# Patient Record
Sex: Male | Born: 1945 | Race: White | Hispanic: No | Marital: Married | Smoking: Former smoker
Health system: Southern US, Community
[De-identification: ages and names within clinical notes are randomized; demographics above are authoritative.]

## PROBLEM LIST (undated history)

## (undated) DIAGNOSIS — K219 Gastro-esophageal reflux disease without esophagitis: Secondary | ICD-10-CM

## (undated) DIAGNOSIS — K5730 Diverticulosis of large intestine without perforation or abscess without bleeding: Secondary | ICD-10-CM

## (undated) DIAGNOSIS — K649 Unspecified hemorrhoids: Secondary | ICD-10-CM

## (undated) DIAGNOSIS — E785 Hyperlipidemia, unspecified: Secondary | ICD-10-CM

## (undated) DIAGNOSIS — K635 Polyp of colon: Secondary | ICD-10-CM

## (undated) HISTORY — DX: Diverticulosis of large intestine without perforation or abscess without bleeding: K57.30

## (undated) HISTORY — DX: Gastro-esophageal reflux disease without esophagitis: K21.9

## (undated) HISTORY — DX: Unspecified hemorrhoids: K64.9

## (undated) HISTORY — DX: Hyperlipidemia, unspecified: E78.5

## (undated) HISTORY — DX: Polyp of colon: K63.5

## (undated) HISTORY — PX: LUMBAR LAMINECTOMY: SHX95

## (undated) HISTORY — PX: INGUINAL HERNIA REPAIR: SHX194

---

## 2004-03-07 ENCOUNTER — Ambulatory Visit: Payer: Self-pay | Admitting: Internal Medicine

## 2004-03-22 ENCOUNTER — Ambulatory Visit (HOSPITAL_COMMUNITY): Admission: RE | Admit: 2004-03-22 | Discharge: 2004-03-22 | Payer: Self-pay | Admitting: Specialist

## 2007-03-19 DIAGNOSIS — K635 Polyp of colon: Secondary | ICD-10-CM

## 2007-03-19 HISTORY — DX: Polyp of colon: K63.5

## 2007-04-01 ENCOUNTER — Ambulatory Visit: Payer: Self-pay | Admitting: Internal Medicine

## 2007-04-02 ENCOUNTER — Telehealth: Payer: Self-pay | Admitting: Internal Medicine

## 2007-04-02 LAB — CONVERTED CEMR LAB
ALT: 17 units/L (ref 0–53)
AST: 18 units/L (ref 0–37)
Alkaline Phosphatase: 48 units/L (ref 39–117)
BUN: 14 mg/dL (ref 6–23)
Basophils Relative: 0.1 % (ref 0.0–1.0)
CO2: 28 meq/L (ref 19–32)
Calcium: 9.2 mg/dL (ref 8.4–10.5)
Chloride: 106 meq/L (ref 96–112)
Cholesterol: 287 mg/dL (ref 0–200)
Creatinine, Ser: 0.9 mg/dL (ref 0.4–1.5)
Eosinophils Relative: 2.8 % (ref 0.0–5.0)
GFR calc Af Amer: 109 mL/min
HDL: 41.5 mg/dL (ref >39.0)
Monocytes Relative: 7.8 % (ref 3.0–11.0)
Neutro Abs: 3.5 10*3/uL (ref 1.4–7.7)
Platelets: 215 10*3/uL (ref 150–400)
RBC: 4.88 M/uL (ref 4.22–5.81)
RDW: 13 % (ref 11.5–14.6)
TSH: 1.44 microintl units/mL (ref 0.35–5.50)
Total CHOL/HDL Ratio: 6.9
Total Protein: 6.7 g/dL (ref 6.0–8.3)
Triglycerides: 171 mg/dL — ABNORMAL HIGH (ref 0–149)
VLDL: 34 mg/dL (ref 0–40)
WBC: 5.1 10*3/uL (ref 4.5–10.5)

## 2007-04-27 ENCOUNTER — Ambulatory Visit: Payer: Self-pay | Admitting: Gastroenterology

## 2007-05-11 ENCOUNTER — Ambulatory Visit: Payer: Self-pay | Admitting: Gastroenterology

## 2007-05-11 ENCOUNTER — Encounter: Payer: Self-pay | Admitting: Gastroenterology

## 2012-03-18 DIAGNOSIS — K649 Unspecified hemorrhoids: Secondary | ICD-10-CM

## 2012-03-18 DIAGNOSIS — K5730 Diverticulosis of large intestine without perforation or abscess without bleeding: Secondary | ICD-10-CM

## 2012-03-18 HISTORY — DX: Diverticulosis of large intestine without perforation or abscess without bleeding: K57.30

## 2012-03-18 HISTORY — DX: Unspecified hemorrhoids: K64.9

## 2012-07-16 HISTORY — PX: COLONOSCOPY: SHX174

## 2012-08-24 ENCOUNTER — Encounter: Payer: Self-pay | Admitting: *Deleted

## 2012-08-27 ENCOUNTER — Encounter: Payer: Self-pay | Admitting: Gastroenterology

## 2012-08-27 ENCOUNTER — Other Ambulatory Visit (INDEPENDENT_AMBULATORY_CARE_PROVIDER_SITE_OTHER): Payer: Medicare Other

## 2012-08-27 ENCOUNTER — Ambulatory Visit (INDEPENDENT_AMBULATORY_CARE_PROVIDER_SITE_OTHER): Payer: Medicare Other | Admitting: Gastroenterology

## 2012-08-27 VITALS — BP 134/76 | HR 61 | Ht 68.25 in | Wt 208.5 lb

## 2012-08-27 DIAGNOSIS — Z860100 Personal history of colon polyps, unspecified: Secondary | ICD-10-CM

## 2012-08-27 DIAGNOSIS — R1011 Right upper quadrant pain: Secondary | ICD-10-CM

## 2012-08-27 LAB — COMPREHENSIVE METABOLIC PANEL
ALT: 20 U/L (ref 0–53)
Albumin: 3.8 g/dL (ref 3.5–5.2)
CO2: 25 mEq/L (ref 19–32)
Calcium: 9.2 mg/dL (ref 8.4–10.5)
Chloride: 109 mEq/L (ref 96–112)
GFR: 89.97 mL/min (ref >60.00)
Potassium: 4.3 mEq/L (ref 3.5–5.1)
Sodium: 142 mEq/L (ref 135–145)
Total Protein: 6.8 g/dL (ref 6.0–8.3)

## 2012-08-27 LAB — CBC WITH DIFFERENTIAL/PLATELET
Basophils Absolute: 0 10*3/uL (ref 0.0–0.1)
Lymphocytes Relative: 28.1 % (ref 12.0–46.0)
Monocytes Relative: 9 % (ref 3.0–12.0)
Platelets: 208 10*3/uL (ref 150.0–400.0)
RDW: 13.6 % (ref 11.5–14.6)
WBC: 5.3 10*3/uL (ref 4.5–10.5)

## 2012-08-27 LAB — HEPATIC FUNCTION PANEL
AST: 22 U/L (ref 0–37)
Albumin: 3.8 g/dL (ref 3.5–5.2)
Alkaline Phosphatase: 51 U/L (ref 39–117)
Total Protein: 6.8 g/dL (ref 6.0–8.3)

## 2012-08-27 LAB — TSH: TSH: 1.09 u[IU]/mL (ref 0.35–5.50)

## 2012-08-27 LAB — AMYLASE: Amylase: 89 U/L (ref 27–131)

## 2012-08-27 LAB — IBC PANEL: Transferrin: 220.9 mg/dL (ref 212.0–360.0)

## 2012-08-27 NOTE — Patient Instructions (Addendum)
  You have been scheduled for an endoscopy with propofol. Please follow written instructions given to you at your visit today. If you use inhalers (even only as needed), please bring them with you on the day of your procedure. Your physician has requested that you go to www.[email or web address].com and enter the access code given to you at your visit today. This web site gives a general overview about your procedure. However, you should still follow specific instructions given to you by our office regarding your preparation for the procedure.  You have been scheduled for an abdominal ultrasound at [facility]iology (1st floor of hospital) on 09-01-2012 at 9am. Please arrive 15 minutes prior to your appointment for registration. Make certain not to have anything to eat or drink 6 hours prior to your appointment. Should you need to reschedule your appointment, please contact radiology at [phone]. This test typically takes about 30 minutes to perform.  Your physician has requested that you go to the basement for the following lab work before leaving today: Anemia panel Liver function panel Amylase Lipase Celiac panel  __________________________________________________________________________________________________________________________                                               We are excited to introduce MyChart, a new best-in-class service that provides you online access to important information in your electronic medical record. We want to make it easier for you to view your health information - all in one secure location - when and where you need it. We expect MyChart will enhance the quality of care and service we provide.  When you register for MyChart, you can:    View your test results.    Request appointments and receive appointment reminders via email.    Request medication renewals.    View your medical history, allergies, medications and immunizations.    Communicate with  your physician's office through a password-protected site.    Conveniently print information such as your medication lists.  To find out if MyChart is right for you, please talk to a member of our clinical staff today. We will gladly answer your questions about this free health and wellness tool.  If you are age 16 or older and want a member of your family to have access to your record, you must provide written consent by completing a proxy form available at our office. Please speak to our clinical staff about guidelines regarding accounts for patients younger than age 16.  As you activate your MyChart account and need any technical assistance, please call the MyChart technical support line at [phone]) or email your question to [email or web address]. If you email your question(s), please include your name, a return phone number and the best time to reach you.  If you have non-urgent health-related questions, you can send a message to our office through MyChart at [email or web address]. If you have a medical emergency, call 911.  Thank you for using MyChart as your new health and wellness resource!   MyChart licensed from [employer],  6644-0347. Patents Pending.

## 2012-08-27 NOTE — Progress Notes (Signed)
History of Present Illness:  This is a 67 year old Caucasian male who I did screening colonoscopy on the removal of a small polyp in February 2009.  He subsequently had a negative colonoscopy as well.  He pertains Ernest Davenport today with a 6 month history of numbness and vague discomfort in the right upper quadrant area true reflux symptoms, dysphagia, or specific hepatobiliary complaints.  He is been on Nexium 40 mg a day for 6 months without improvement.  He has fairly regular bowel movements without melena or hematochezia.  He does not smoke or abuse ethanol but does take daily NSAIDs.  There is no history of hepatitis, pancreatitis, ascites, or known liver disease.  He denies anorexia, weight loss, or any specific food intolerances or a significant family history.  Her only medications at this time on Nexium, aspirin, and Zocor.  I have reviewed this patient's present history, medical and surgical past history, allergies and medications.     ROS:   All systems were reviewed and are negative unless otherwise stated in the HPI.    Physical Exam: Blood pressure 134/76, pulse 61 and regular weight 208 the BMI of 31.45. General well developed well nourished patient in no acute distress, appearing their stated age Eyes PERRLA, no icterus, fundoscopic exam per opthamologist Skin no lesions noted Neck supple, no adenopathy, no thyroid enlargement, no tenderness Chest clear to percussion and auscultation Heart no significant murmurs, gallops or rubs noted Abdomen no hepatosplenomegaly masses or tenderness, BS normal.  Extremities no acute joint lesions, edema, phlebitis or evidence of cellulitis. Neurologic patient oriented x 3, cranial nerves intact, no focal neurologic deficits noted. Psychological mental status normal and normal affect.  Assessment and plan: Atypical abdominal pain in an elderly patient he takes daily NSAIDs, rule out NSAID gastropathy or ulcerations.  Other considerations would be  atypical presentation of cholelithiasis.  I've scheduled upper abdominal ultrasound exam and endoscopic exam while continuing daily PPI.  Labs ordered for review including liver tests, amylase, lipase, CBC and anemia profile.  Records from another gastroenterologist office requested.  No diagnosis found.

## 2012-08-28 LAB — CELIAC PANEL 10
Gliadin IgA: 6.9 U/mL (ref <20)
Gliadin IgG: 10.9 U/mL (ref <20)
Tissue Transglutaminase Ab, IgA: 5.8 U/mL (ref <20)

## 2012-09-01 ENCOUNTER — Ambulatory Visit (HOSPITAL_COMMUNITY)
Admission: RE | Admit: 2012-09-01 | Discharge: 2012-09-01 | Disposition: A | Discharge status: Home / Self Care | Payer: Medicare Other | Source: Ambulatory Visit | Attending: Gastroenterology | Admitting: Gastroenterology

## 2012-09-01 DIAGNOSIS — R1011 Right upper quadrant pain: Secondary | ICD-10-CM

## 2012-09-02 ENCOUNTER — Encounter: Payer: Self-pay | Admitting: Gastroenterology

## 2012-09-07 ENCOUNTER — Encounter: Payer: Self-pay | Admitting: Gastroenterology

## 2012-09-07 ENCOUNTER — Ambulatory Visit (AMBULATORY_SURGERY_CENTER): Payer: Medicare Other | Admitting: Gastroenterology

## 2012-09-07 VITALS — BP 134/69 | HR 53 | Temp 96.6°F | Resp 17 | Ht 68.0 in | Wt 208.0 lb

## 2012-09-07 DIAGNOSIS — K2990 Gastroduodenitis, unspecified, without bleeding: Secondary | ICD-10-CM

## 2012-09-07 DIAGNOSIS — R1011 Right upper quadrant pain: Secondary | ICD-10-CM

## 2012-09-07 DIAGNOSIS — K2970 Gastritis, unspecified, without bleeding: Secondary | ICD-10-CM

## 2012-09-07 DIAGNOSIS — A048 Other specified bacterial intestinal infections: Secondary | ICD-10-CM

## 2012-09-07 MED ORDER — SODIUM CHLORIDE 0.9 % IV SOLN: 500.0000 mL | INTRAVENOUS | Status: DC

## 2012-09-07 NOTE — Progress Notes (Signed)
Procedure ends, to recovery, report given and VSS. 

## 2012-09-07 NOTE — Op Note (Signed)
[facility] [address] [place], [address]   ENDOSCOPY PROCEDURE REPORT  PATIENT: Ernest Davenport, Ernest Davenport  MR#: [id number] BIRTHDATE: [birth year] , 67  yrs. old GENDER: Male ENDOSCOPIST:Alanea Woolridge Hale Bogus, MD, Clementeen Graham REFERRED BY: Gabriel Cirri, M.D. PROCEDURE DATE:  09/07/2012 PROCEDURE:   EGD w/ biopsy and EGD w/ biopsy for H.pylori ASA CLASS:    Class II INDICATIONS: MEDICATION: Propofol (Diprivan) 130 mg IV TOPICAL ANESTHETIC:   Cetacaine Spray  DESCRIPTION OF PROCEDURE:   After the risks and benefits of the procedure were explained, informed consent was obtained.  The LB [id number]  endoscope was introduced through the mouth  and advanced to the second portion of the duodenum .  The instrument was slowly withdrawn as the mucosa was fully examined.      ESOPHAGUS: The mucosa of the esophagus appeared normal.  STOMACH: Abnormal mucosa was found.scattered areas of erythema and edema in the fundus and body were biopsied and CLO biopsy was done   DUODENUM: The duodenal mucosa showed no abnormalities in the bulb and second portion of the duodenum. s. .  ENDOSCOPIC IMPRESSION: 1.   The mucosa of the esophagus appeared normal 2.   Abnormal mucosa was found in the stomach...r/o H.pylori 3.   The duodenal mucosa showed no abnormalities in the bulb and second portion of the duodenum  RECOMMENDATIONS: 1.  Await biopsy results 2.  Rx CLO if positive 3.  Continue current medications    _______________________________ eSigned:  Mardella Layman, MD, [facility] 09/07/2012 2:40 PM

## 2012-09-07 NOTE — Progress Notes (Signed)
Patient did not have preoperative order for IV antibiotic SSI prophylaxis. (G8918)  Patient did not experience any of the following events: a burn prior to discharge; a fall within the facility; wrong site/side/patient/procedure/implant event; or a hospital transfer or hospital admission upon discharge from the facility. (G8907)  

## 2012-09-07 NOTE — Progress Notes (Signed)
Called to room to assist during endoscopic procedure.  Patient ID and intended procedure confirmed with present staff. Received instructions for my participation in the procedure from the performing physician. ewm 

## 2012-09-07 NOTE — Patient Instructions (Addendum)
YOU HAD AN ENDOSCOPIC PROCEDURE TODAY AT THE [facility]: Refer to the procedure report that was given to you for any specific questions about what was found during the examination.  If the procedure report does not answer your questions, please call your gastroenterologist to clarify.  If you requested that your care partner not be given the details of your procedure findings, then the procedure report has been included in a sealed envelope for you to review at your convenience later.  YOU SHOULD EXPECT: Some feelings of bloating in the abdomen. Passage of more gas than usual.  Walking can help get rid of the air that was put into your GI tract during the procedure and reduce the bloating. If you had a lower endoscopy (such as a colonoscopy or flexible sigmoidoscopy) you may notice spotting of blood in your stool or on the toilet paper. If you underwent a bowel prep for your procedure, then you may not have a normal bowel movement for a few days.  DIET: Your first meal following the procedure should be a light meal and then it is ok to progress to your normal diet.  A half-sandwich or bowl of soup is an example of a good first meal.  Heavy or fried foods are harder to digest and may make you feel nauseous or bloated.  Likewise meals heavy in dairy and vegetables can cause extra gas to form and this can also increase the bloating.  Drink plenty of fluids but you should avoid alcoholic beverages for 24 hours.  ACTIVITY: Your care partner should take you home directly after the procedure.  You should plan to take it easy, moving slowly for the rest of the day.  You can resume normal activity the day after the procedure however you should NOT DRIVE or use heavy machinery for 24 hours (because of the sedation medicines used during the test).    SYMPTOMS TO REPORT IMMEDIATELY: A gastroenterologist can be reached at any hour.  During normal business hours, 8:30 AM to 5:00 PM Monday through Friday,  call [phone].  After hours and on weekends, please call the GI answering service at [phone] who will take a message and have the physician on call contact you.  Following upper endoscopy (EGD)  Vomiting of blood or coffee ground material  New chest pain or pain under the shoulder blades  Painful or persistently difficult swallowing  New shortness of breath  Fever of 100F or higher  Black, tarry-looking stools  FOLLOW UP: If any biopsies were taken you will be contacted by phone or by letter within the next 1-3 weeks.  Call your gastroenterologist if you have not heard about the biopsies in 3 weeks.  Our staff will call the home number listed on your records the next business day following your procedure to check on you and address any questions or concerns that you may have at that time regarding the information given to you following your procedure. This is a courtesy call and so if there is no answer at the home number and we have not heard from you through the emergency physician on call, we will assume that you have returned to your regular daily activities without incident.  SIGNATURES/CONFIDENTIALITY: You and/or your care partner have signed paperwork which will be entered into your electronic medical record.  These signatures attest to the fact that that the information above on your After Visit Summary has been reviewed and is understood.  Full responsibility of   the confidentiality of this discharge information lies with you and/or your care-partner. 

## 2012-09-08 ENCOUNTER — Telehealth: Payer: Self-pay

## 2012-09-08 ENCOUNTER — Encounter: Payer: Self-pay | Admitting: Gastroenterology

## 2012-09-08 NOTE — Telephone Encounter (Signed)
  Follow up Call-  Call back number 09/07/2012  Post procedure Call Back phone  # [phone]  Permission to leave phone message No  comments doesn't do messages     Patient questions:  Do you have a fever, pain , or abdominal swelling? no Pain Score  0 *  Have you tolerated food without any problems? yes  Have you been able to return to your normal activities? yes  Do you have any questions about your discharge instructions: Diet   no Medications  no Follow up visit  no  Do you have questions or concerns about your Care? no  Actions: * If pain score is 4 or above: No action needed, pain <4.

## 2012-09-11 ENCOUNTER — Encounter: Payer: Self-pay | Admitting: Gastroenterology

## 2012-09-14 ENCOUNTER — Telehealth: Payer: Self-pay | Admitting: *Deleted

## 2012-09-14 MED ORDER — CYANOCOBALAMIN 1000 MCG/ML IJ SOLN: INTRAMUSCULAR | Status: AC

## 2012-09-14 MED ORDER — AMOXICILL-CLARITHRO-LANSOPRAZ PO MISC: Freq: Two times a day (BID) | ORAL | Status: DC

## 2012-09-14 NOTE — Telephone Encounter (Signed)
  Value Range UREASE  Positive   Negative  Informed pt of + H. Pylori and the need for AB. I will order it at [facility] and if too expensive, will order drugs separately. Also informed him of the need for Vit B12 injections; faxed script and notes to Dr Gabriel Cirri in [place] at phone [phone]   Fax [phone]

## 2012-09-14 NOTE — Telephone Encounter (Signed)
Message copied by Florene Glen on Mon Sep 14, 2012  8:05 AM ------      Message from: Jarold Motto, DAVID R      Created: Thu Aug 27, 2012 11:25 AM       B12 shots and nasal spray ------

## 2012-09-15 MED ORDER — ESOMEPRAZOLE MAGNESIUM 40 MG PO CPDR: DELAYED_RELEASE_CAPSULE | ORAL | Status: DC

## 2012-09-15 MED ORDER — CLARITHROMYCIN 500 MG PO TABS: 500.0000 mg | ORAL_TABLET | Freq: Two times a day (BID) | ORAL | Status: DC

## 2012-09-15 MED ORDER — AMOXICILLIN 500 MG PO CAPS: 1000.0000 mg | ORAL_CAPSULE | Freq: Two times a day (BID) | ORAL | Status: DC

## 2012-09-15 NOTE — Telephone Encounter (Signed)
Informed pt I never got the Prior Auth, so I ordered the drugs individually; pt stated understanding.

## 2012-09-25 ENCOUNTER — Telehealth: Payer: Self-pay | Admitting: Gastroenterology

## 2012-09-25 MED ORDER — CLARITHROMYCIN 500 MG PO TABS: 500.0000 mg | ORAL_TABLET | Freq: Two times a day (BID) | ORAL | Status: AC

## 2012-09-25 MED ORDER — AMOXICILLIN 500 MG PO CAPS: 1000.0000 mg | ORAL_CAPSULE | Freq: Two times a day (BID) | ORAL | Status: AC

## 2012-09-25 MED ORDER — ESOMEPRAZOLE MAGNESIUM 40 MG PO CPDR: DELAYED_RELEASE_CAPSULE | ORAL | Status: AC

## 2012-09-25 NOTE — Telephone Encounter (Signed)
Pt came to [place] on 08/27/12 for occasional abdominal pain and numbness on his right side at his rib cage down to his waist. He was + for H. Pylori and started Biaxin and Amoxicillin with BID Nexium for 7 days . Pt states the numbness went away with the meds until 2 days ago when he completed the AB. Also realized while talking to pt, he has bought OTC Nexium and took it once daily ; explained to pt the OTC is only 20 mg. Spoke with Doug Sou, PA and she suggested pt start the AB for an additional 7 days with BID Nexium 40 mg. Pt stated understanding.

## 2012-10-15 ENCOUNTER — Telehealth: Payer: Self-pay | Admitting: Gastroenterology

## 2012-10-15 NOTE — Telephone Encounter (Signed)
Pt seen 08/27/12 for numbness in his right side from the top of his ribs down to his stomach. He had an abdominal u/s on 09/01/12 that showed a normal gallbladder, but a fatty liver with normal liver labs. He then had an EGD on 09/07/12 and pt was + for H.Pylori. He took 7 days of antibiotics, but was taking the wrong dose of Nexium. He reported the numbness returned when AB were completed. Another 7 days of Biaxin,Amoxicillin and Nexium were ordered. Today, pt reports he completed the AB and now the numbness has returned to his side; he has no pain, just numbness. He also reports his stomach burns, but he has switched to OTC Nexium which is only 20 mg. Please advise on NUMBNESS. Thanks.

## 2012-10-16 NOTE — Telephone Encounter (Signed)
Not GI ,,see his 1 care.other causes of neuropathy

## 2012-10-16 NOTE — Telephone Encounter (Signed)
Informed pt of Dr Norval Gable instructions; pt stated understanding.

## 2014-08-13 IMAGING — US US ABDOMEN COMPLETE
1 series · 13 of 25 positions shown · non-contrast
Comparison: None

CLINICAL DATA: Right upper quadrant abdominal pain.

ABDOMEN ULTRASOUND
TECHNIQUE: Complete abdominal ultrasound examination was performed
including evaluation of the liver, gallbladder, bile ducts,
pancreas, kidneys, spleen, IVC, and abdominal aorta.

[Series 1: us abdomen complete · 0.33mm/px · 13 of 74 slices shown]
[im 1/74]
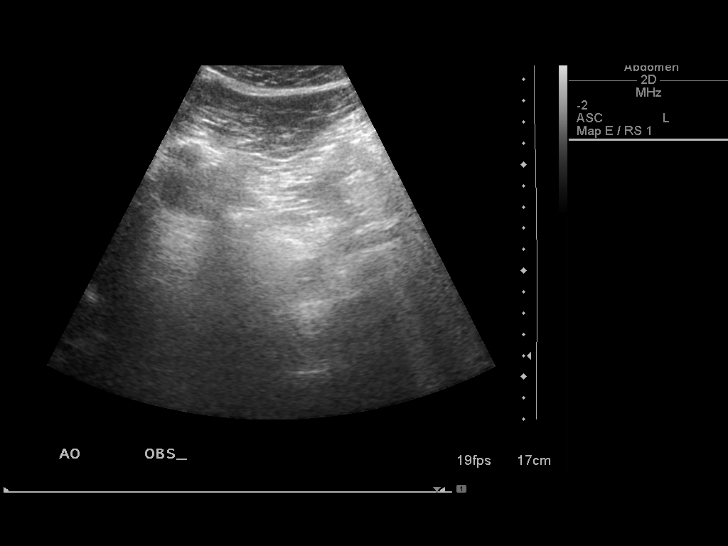
[im 7/74]
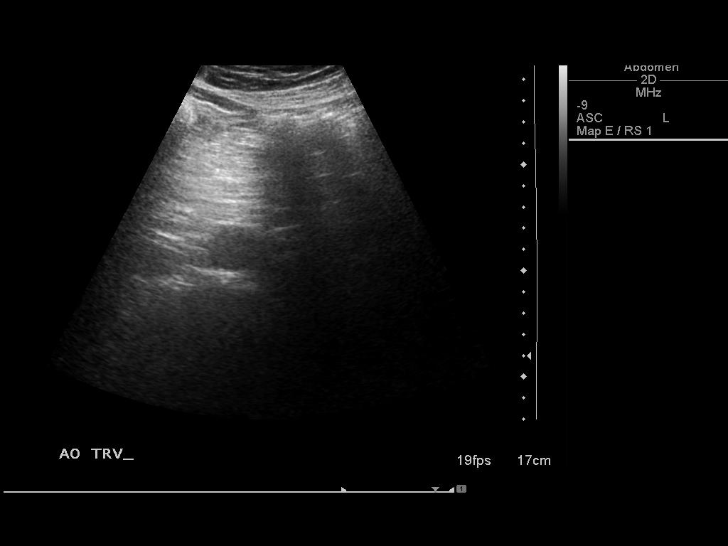
[im 13/74]
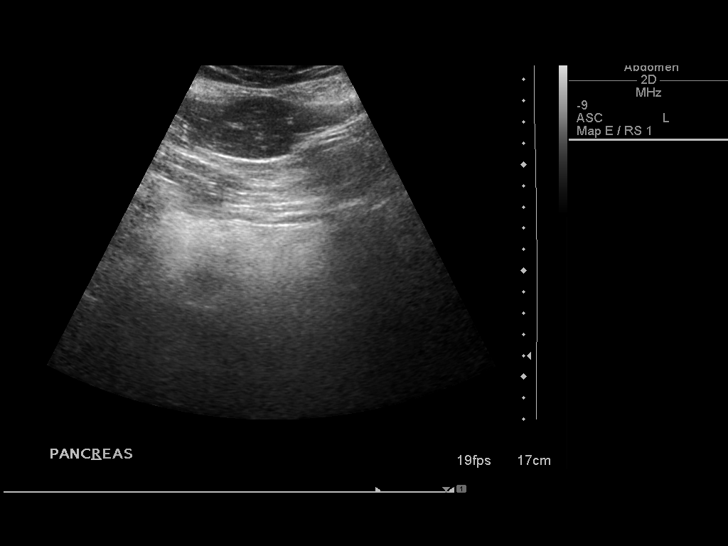
[im 19/74]
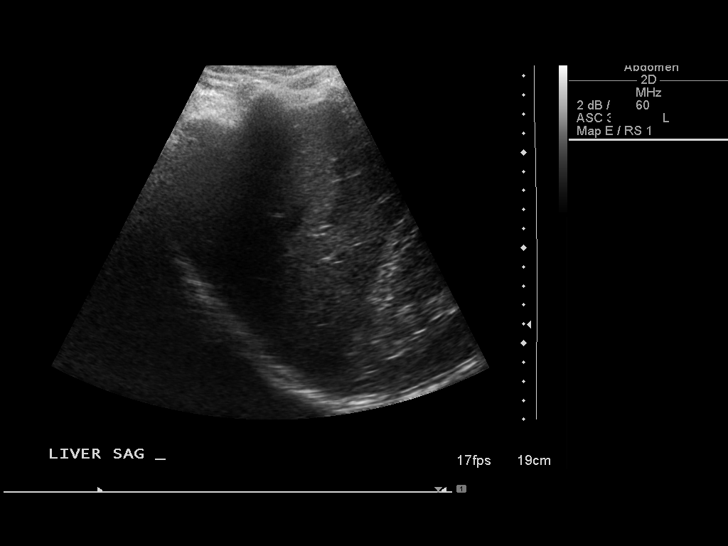
[im 25/74]
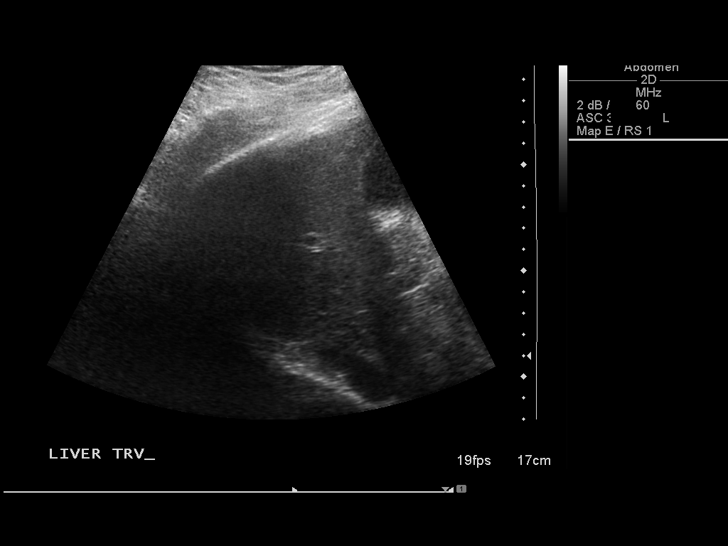
[im 31/74]
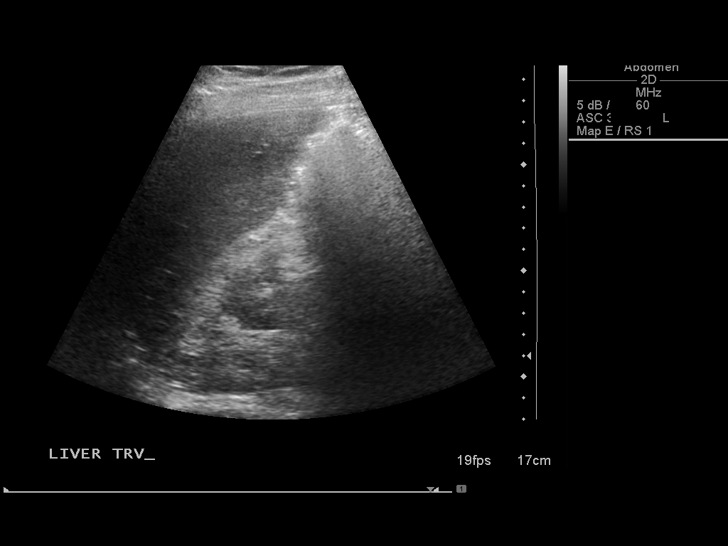
[im 37/74]
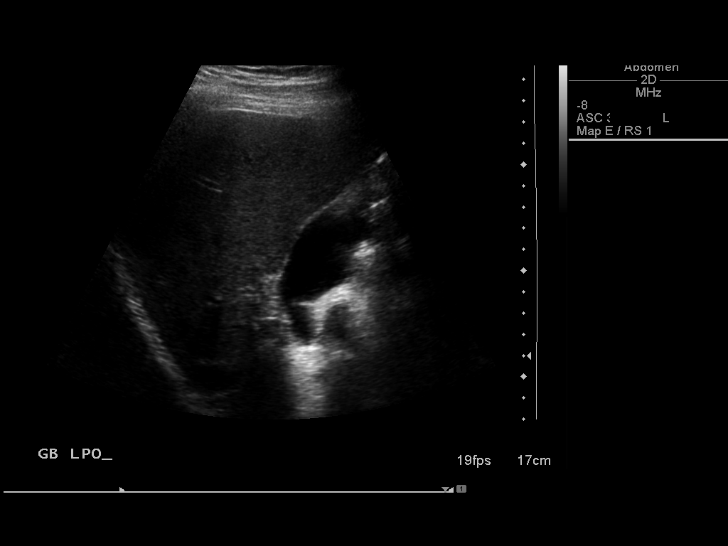
[im 43/74]
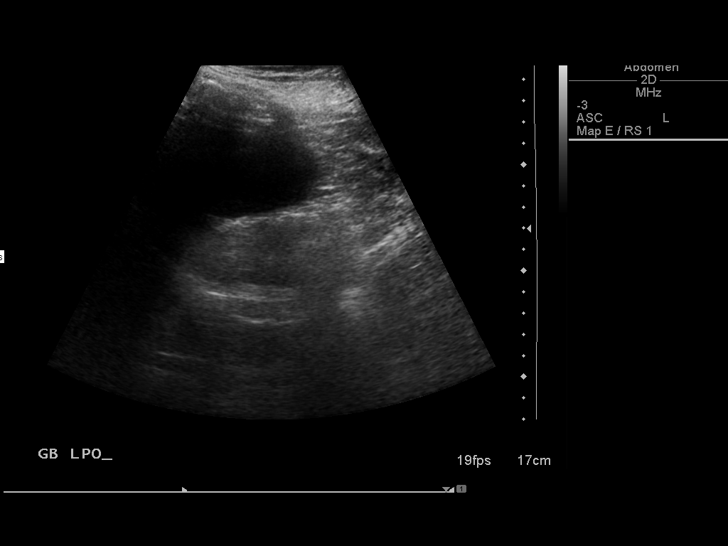
[im 49/74]
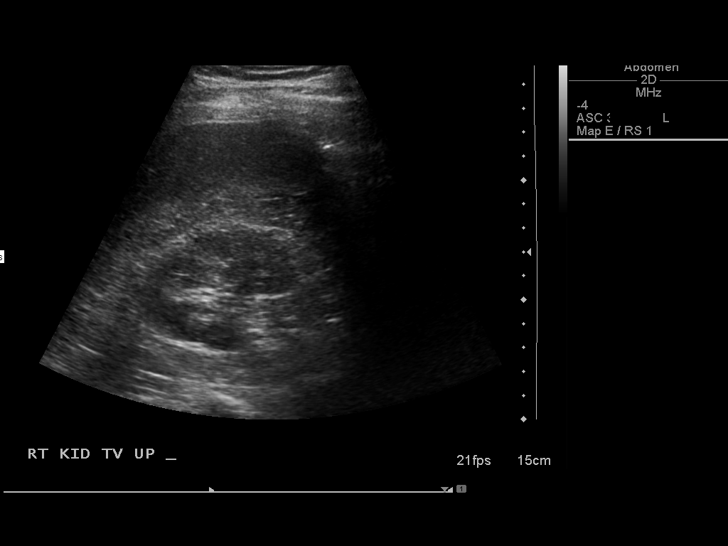
[im 55/74]
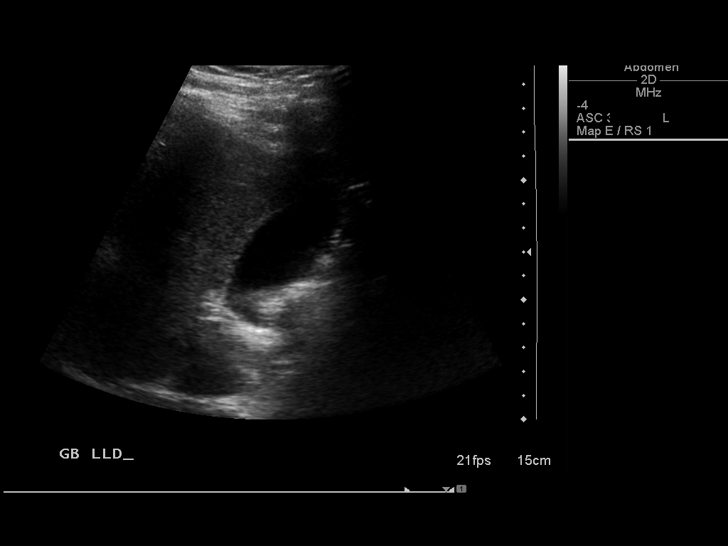
[im 61/74]
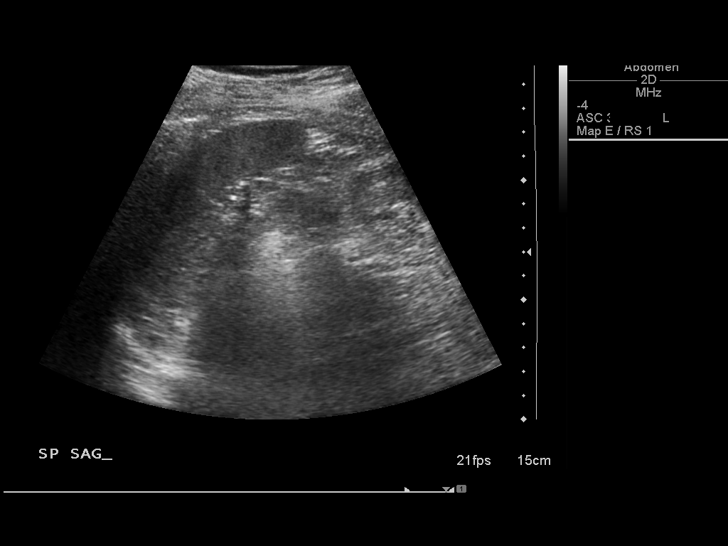
[im 67/74]
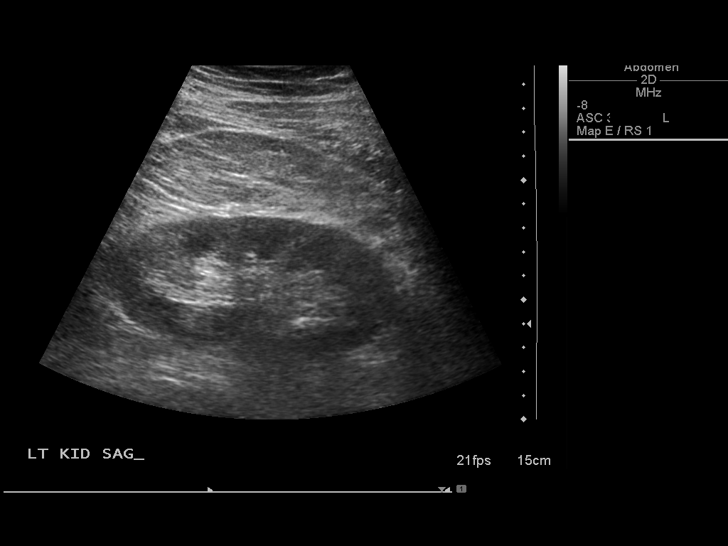
[im 74/74]
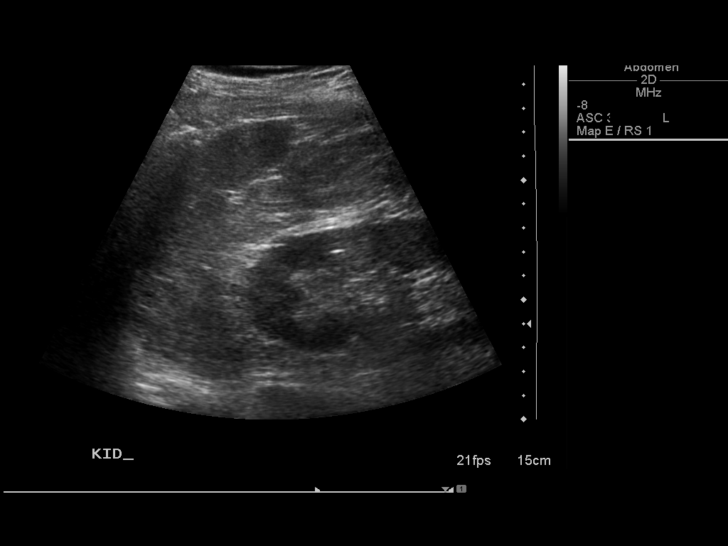

[13 of 25 positions shown; findings below may reference images not displayed]

FINDINGS: Gallbladder:  No shadowing gallstones or echogenic sludge.  No
gallbladder wall thickening or pericholecystic fluid.  Negative
sonographic Murphy's sign according to the ultrasound technologist.

Common Bile Duct:  Maximal diameter of 6 mm.

Liver:  The liver shows mildly increased diffuse echogenicity and
heterogeneity, likely reflecting some degree of hepatic steatosis.
No focal mass or evidence of intrahepatic biliary ductal
dilatation.

IVC:  Patent throughout its visualized course in the abdomen.

Pancreas:  Although the pancreas is difficult to visualize in its
entirety, no focal pancreatic abnormality is identified.

Spleen:  The spleen is of normal echotexture and size.

Kidneys:  The right kidney measures 12 cm and the left kidney
cm in length. There is no evidence of hydronephrosis or renal
lesion bilaterally.  The right kidney shows mild cortical thinning
relative to the left.

Abdominal Aorta:  The abdominal aorta is poorly visualized.
Visualized segments show normal caliber.
IMPRESSION: 1.  Mildly increased echogenicity of the liver likely reflecting
hepatic steatosis.
2.  Suggestion of mild right renal cortical atrophy.

## 2015-02-09 ENCOUNTER — Encounter (HOSPITAL_COMMUNITY): Payer: Self-pay | Admitting: Cardiology

## 2015-02-09 ENCOUNTER — Emergency Department (HOSPITAL_COMMUNITY)
Admission: EM | Admit: 2015-02-09 | Discharge: 2015-02-09 | Disposition: A | Discharge status: Home / Self Care | Payer: Medicare Other | Attending: Emergency Medicine | Admitting: Emergency Medicine

## 2015-02-09 ENCOUNTER — Emergency Department (HOSPITAL_COMMUNITY): Payer: Medicare Other

## 2015-02-09 DIAGNOSIS — N200 Calculus of kidney: Secondary | ICD-10-CM | POA: Insufficient documentation

## 2015-02-09 DIAGNOSIS — R112 Nausea with vomiting, unspecified: Secondary | ICD-10-CM | POA: Insufficient documentation

## 2015-02-09 DIAGNOSIS — R1084 Generalized abdominal pain: Secondary | ICD-10-CM | POA: Diagnosis present

## 2015-02-09 DIAGNOSIS — Z86010 Personal history of colonic polyps: Secondary | ICD-10-CM | POA: Diagnosis not present

## 2015-02-09 DIAGNOSIS — F17210 Nicotine dependence, cigarettes, uncomplicated: Secondary | ICD-10-CM | POA: Diagnosis not present

## 2015-02-09 DIAGNOSIS — K219 Gastro-esophageal reflux disease without esophagitis: Secondary | ICD-10-CM | POA: Diagnosis not present

## 2015-02-09 DIAGNOSIS — E785 Hyperlipidemia, unspecified: Secondary | ICD-10-CM | POA: Diagnosis not present

## 2015-02-09 DIAGNOSIS — Z7982 Long term (current) use of aspirin: Secondary | ICD-10-CM | POA: Insufficient documentation

## 2015-02-09 LAB — COMPREHENSIVE METABOLIC PANEL
ALBUMIN: 3.7 g/dL (ref 3.5–5.0)
ALK PHOS: 54 U/L (ref 38–126)
ALT: 17 U/L (ref 17–63)
ANION GAP: 6 (ref 5–15)
AST: 24 U/L (ref 15–41)
BILIRUBIN TOTAL: 0.8 mg/dL (ref 0.3–1.2)
BUN: 14 mg/dL (ref 6–20)
CALCIUM: 9.3 mg/dL (ref 8.9–10.3)
CO2: 25 mmol/L (ref 22–32)
Chloride: 107 mmol/L (ref 101–111)
Creatinine, Ser: 1.21 mg/dL (ref 0.61–1.24)
GFR calc non Af Amer: 58 mL/min — ABNORMAL LOW (ref >60)
Glucose, Bld: 218 mg/dL — ABNORMAL HIGH (ref 65–99)
POTASSIUM: 4 mmol/L (ref 3.5–5.1)
SODIUM: 138 mmol/L (ref 135–145)
TOTAL PROTEIN: 6.5 g/dL (ref 6.5–8.1)

## 2015-02-09 LAB — CBC
HEMATOCRIT: 42.8 % (ref 39.0–52.0)
HEMOGLOBIN: 14.3 g/dL (ref 13.0–17.0)
MCH: 30.8 pg (ref 26.0–34.0)
MCHC: 33.4 g/dL (ref 30.0–36.0)
MCV: 92.2 fL (ref 78.0–100.0)
Platelets: 195 10*3/uL (ref 150–400)
RBC: 4.64 MIL/uL (ref 4.22–5.81)
RDW: 13.2 % (ref 11.5–15.5)
WBC: 7.2 10*3/uL (ref 4.0–10.5)

## 2015-02-09 LAB — URINE MICROSCOPIC-ADD ON

## 2015-02-09 LAB — URINALYSIS, ROUTINE W REFLEX MICROSCOPIC
BILIRUBIN URINE: NEGATIVE
Glucose, UA: 500 mg/dL — AB
HGB URINE DIPSTICK: NEGATIVE
Ketones, ur: 15 mg/dL — AB
LEUKOCYTES UA: NEGATIVE
NITRITE: NEGATIVE
PROTEIN: 30 mg/dL — AB
Specific Gravity, Urine: 1.024 (ref 1.005–1.030)
pH: 5.5 (ref 5.0–8.0)

## 2015-02-09 LAB — LIPASE, BLOOD: Lipase: 26 U/L (ref 11–51)

## 2015-02-09 MED ORDER — OXYCODONE-ACETAMINOPHEN 5-325 MG PO TABS: 2.0000 | ORAL_TABLET | ORAL | Status: AC | PRN

## 2015-02-09 MED ORDER — KETOROLAC TROMETHAMINE 30 MG/ML IJ SOLN
30.0000 mg | Freq: Once | INTRAMUSCULAR | Status: AC
Start: 2015-02-09 — End: 2015-02-09
  Administered 2015-02-09: 30 mg via INTRAVENOUS
  Filled 2015-02-09: qty 1

## 2015-02-09 MED ORDER — KETOROLAC TROMETHAMINE 60 MG/2ML IM SOLN: 30.0000 mg | Freq: Once | INTRAMUSCULAR | Status: DC

## 2015-02-09 MED ORDER — TAMSULOSIN HCL 0.4 MG PO CAPS: 0.4000 mg | ORAL_CAPSULE | Freq: Every day | ORAL | Status: AC

## 2015-02-09 MED ORDER — SODIUM CHLORIDE 0.9 % IV SOLN
Freq: Once | INTRAVENOUS | Status: AC
  Administered 2015-02-09: 18:00:00 via INTRAVENOUS

## 2015-02-09 MED ORDER — IOHEXOL 300 MG/ML  SOLN
100.0000 mL | Freq: Once | INTRAMUSCULAR | Status: AC | PRN
Start: 2015-02-09 — End: 2015-02-09
  Administered 2015-02-09: 100 mL via INTRAVENOUS

## 2015-02-09 NOTE — Discharge Instructions (Signed)
Kidney Stones °Kidney stones (urolithiasis) are deposits that form inside your kidneys. The intense pain is caused by the stone moving through the urinary tract. When the stone moves, the ureter goes into spasm around the stone. The stone is usually passed in the urine.  °CAUSES  °· A disorder that makes certain neck glands produce too much parathyroid hormone (primary hyperparathyroidism). °· A buildup of uric acid crystals, similar to gout in your joints. °· Narrowing (stricture) of the ureter. °· A kidney obstruction present at birth (congenital obstruction). °· Previous surgery on the kidney or ureters. °· Numerous kidney infections. °SYMPTOMS  °· Feeling sick to your stomach (nauseous). °· Throwing up (vomiting). °· Blood in the urine (hematuria). °· Pain that usually spreads (radiates) to the groin. °· Frequency or urgency of urination. °DIAGNOSIS  °· Taking a history and physical exam. °· Blood or urine tests. °· CT scan. °· Occasionally, an examination of the inside of the urinary bladder (cystoscopy) is performed. °TREATMENT  °· Observation. °· Increasing your fluid intake. °· Extracorporeal shock wave lithotripsy--This is a noninvasive procedure that uses shock waves to break up kidney stones. °· Surgery may be needed if you have severe pain or persistent obstruction. There are various surgical procedures. Most of the procedures are performed with the use of small instruments. Only small incisions are needed to accommodate these instruments, so recovery time is minimized. °The size, location, and chemical composition are all important variables that will determine the proper choice of action for you. Talk to your health care provider to better understand your situation so that you will minimize the risk of injury to yourself and your kidney.  °HOME CARE INSTRUCTIONS  °· Drink enough water and fluids to keep your urine clear or pale yellow. This will help you to pass the stone or stone fragments. °· Strain  all urine through the provided strainer. Keep all particulate matter and stones for your health care provider to see. The stone causing the pain may be as small as a grain of salt. It is very important to use the strainer each and every time you pass your urine. The collection of your stone will allow your health care provider to analyze it and verify that a stone has actually passed. The stone analysis will often identify what you can do to reduce the incidence of recurrences. °· Only take over-the-counter or prescription medicines for pain, discomfort, or fever as directed by your health care provider. °· Keep all follow-up visits as told by your health care provider. This is important. °· Get follow-up X-rays if required. The absence of pain does not always mean that the stone has passed. It may have only stopped moving. If the urine remains completely obstructed, it can cause loss of kidney function or even complete destruction of the kidney. It is your responsibility to make sure X-rays and follow-ups are completed. Ultrasounds of the kidney can show blockages and the status of the kidney. Ultrasounds are not associated with any radiation and can be performed easily in a matter of minutes. °· Make changes to your daily diet as told by your health care provider. You may be told to: °¨ Limit the amount of salt that you eat. °¨ Eat 5 or more servings of fruits and vegetables each day. °¨ Limit the amount of meat, poultry, fish, and eggs that you eat. °· Collect a 24-hour urine sample as told by your health care provider. You may need to collect another urine sample every 6-12   months. °SEEK MEDICAL CARE IF: °· You experience pain that is progressive and unresponsive to any pain medicine you have been prescribed. °SEEK IMMEDIATE MEDICAL CARE IF:  °· Pain cannot be controlled with the prescribed medicine. °· You have a fever or shaking chills. °· The severity or intensity of pain increases over 18 hours and is not  relieved by pain medicine. °· You develop a new onset of abdominal pain. °· You feel faint or pass out. °· You are unable to urinate. °  °This information is not intended to replace advice given to you by your health care provider. Make sure you discuss any questions you have with your health care provider. °  °Document Released: 03/04/2005 Document Revised: 11/23/2014 Document Reviewed: 08/05/2012 °Elsevier Interactive Patient Education ©2016 [employer] ° °

## 2015-02-09 NOTE — ED Notes (Signed)
Pt reports right lower quadrant abd pain that started this morning. Reports he vomited a couple times this morning and unable to hold anything down.

## 2015-02-09 NOTE — ED Notes (Signed)
Pt placed into gown and on to monitor upon arrival to room. Pt remains monitored by blood pressure and pulse ox. Pts family remains at bedside. Pt encouraged to provide urine specimen.

## 2015-02-09 NOTE — ED Provider Notes (Signed)
CSN: [id number]     Arrival date & time 02/09/15  1405 History   First MD Initiated Contact with Patient 02/09/15 1624     Chief Complaint  Patient presents with  . Abdominal Pain     (Consider location/radiation/quality/duration/timing/severity/associated sxs/prior Treatment) Patient is a 69 y.o. male presenting with abdominal pain. The history is provided by the patient. No language interpreter was used.  Abdominal Pain Pain location:  Generalized Pain quality: aching   Pain radiates to:  RLQ Pain severity:  Severe Onset quality:  Gradual Timing:  Constant Progression:  Worsening Chronicity:  New Context: recent illness and suspicious food intake   Context: not diet changes and not eating   Relieved by:  Nothing Worsened by:  Nothing tried Ineffective treatments:  None tried Associated symptoms: nausea and vomiting   Associated symptoms: no dysuria   Risk factors: multiple surgeries   Pt complains of pain in his right lower abdomen.  Pt reports pain became severe and he vomitted this am.  Pt reports pain is not currently as bad.  Past Medical History  Diagnosis Date  . Colon polyps 2009     HYPERPLASTIC POLYPS  . HLD (hyperlipidemia)   . GERD (gastroesophageal reflux disease)   . Diverticulosis of colon (without mention of hemorrhage) 2014    Dr. Ventura Bruns    . Hemorrhoids 2014    Dr. Ventura Bruns   Past Surgical History  Procedure Laterality Date  . Inguinal hernia repair Right   . Lumbar laminectomy      x 2  . Colonoscopy  07/2012    Dr. Chales Abrahams   Family History  Problem Relation Age of Onset  . Stroke Father   . Heart attack Brother    Social History  Substance Use Topics  . Smoking status: Former Smoker    Types: Cigarettes  . Smokeless tobacco: Never Used  . Alcohol Use: Yes     Comment: ocassional    Review of Systems  Gastrointestinal: Positive for nausea, vomiting and abdominal pain.  Genitourinary: Negative for dysuria.  All other systems  reviewed and are negative.     Allergies  Prednisone  Home Medications   Prior to Admission medications   Medication Sig Start Date End Date Taking? Authorizing Provider  aspirin 81 MG tablet Take 81 mg by mouth daily.   Yes Historical Provider, MD  loratadine (CLARITIN) 10 MG tablet Take 10 mg by mouth daily as needed for allergies.   Yes Historical Provider, MD  simvastatin (ZOCOR) 40 MG tablet Take 40 mg by mouth every evening.   Yes Historical Provider, MD  amoxicillin (AMOXIL) 500 MG capsule Take 2 capsules (1,000 mg total) by mouth 2 (two) times daily. Patient not taking: Reported on 02/09/2015 09/25/12   Shanda Bumps D Zehr, PA-C  clarithromycin (BIAXIN) 500 MG tablet Take 1 tablet (500 mg total) by mouth 2 (two) times daily. Patient not taking: Reported on 02/09/2015 09/25/12   Shanda Bumps D Zehr, PA-C  cyanocobalamin (,VITAMIN B-12,) 1000 MCG/ML injection Inject 1 ml into a muscle once weekly for 3 weeks and then monthly for one year. Patient not taking: Reported on 02/09/2015 09/14/12   Mardella Layman, MD  esomeprazole (NEXIUM) 40 MG capsule Take one capsule by mouth twice daily for 7 days while on Amoxicillin and Biaxin for H. Pylori. Patient not taking: Reported on 02/09/2015 09/25/12   Princella Pellegrini Zehr, PA-C   BP 143/72 mmHg  Pulse 72  Temp(Src) 98.5 F (36.9 C) (Oral)  Resp 18  Ht  (1.727 m)  Wt 94.348 kg  BMI 31.63 kg/m2  SpO2 99% Physical Exam  Constitutional: He is oriented to person, place, and time. He appears well-developed and well-nourished.  HENT:  Head: Normocephalic.  Eyes: EOM are normal.  Neck: Normal range of motion.  Cardiovascular: Normal rate and normal heart sounds.   Pulmonary/Chest: Effort normal and breath sounds normal.  Abdominal: He exhibits no distension.  Musculoskeletal: Normal range of motion.  Neurological: He is alert and oriented to person, place, and time.  Skin: Skin is warm.  Psychiatric: He has a normal mood and affect.  Nursing  note and vitals reviewed.   ED Course  Procedures (including critical care time) Labs Review Labs Reviewed  COMPREHENSIVE METABOLIC PANEL - Abnormal; Notable for the following:    Glucose, Bld 218 (*)    GFR calc non Af Amer 58 (*)    All other components within normal limits  URINALYSIS, ROUTINE W REFLEX MICROSCOPIC (NOT AT [facility]) - Abnormal; Notable for the following:    Glucose, UA 500 (*)    Ketones, ur 15 (*)    Protein, ur 30 (*)    All other components within normal limits  URINE MICROSCOPIC-ADD ON - Abnormal; Notable for the following:    Squamous Epithelial / LPF 0-5 (*)    Bacteria, UA RARE (*)    All other components within normal limits  LIPASE, BLOOD  CBC    Imaging Review Ct Abdomen Pelvis W Contrast  02/09/2015  CLINICAL DATA:  Patient with intermittent right flank pain and frequent urination for 5 days. Worsening pain today. EXAM: CT ABDOMEN AND PELVIS WITH CONTRAST TECHNIQUE: Multidetector CT imaging of the abdomen and pelvis was performed using the standard protocol following bolus administration of intravenous contrast. CONTRAST:  OMNIPAQUE IOHEXOL 300 MG/ML  SOLN COMPARISON:  None. FINDINGS: Lower chest: Normal heart size. Dependent atelectasis no pleural effusion or pneumothorax. Hepatobiliary: Liver is normal in size and contour. There is a 2.6 cm cyst within the left hepatic lobe and an adjacent 2.7 cm simple cyst within the left hepatic lobe. Gallbladder is unremarkable. No intrahepatic or extrahepatic biliary ductal dilatation. Pancreas: Unremarkable Spleen: Unremarkable Adrenals/Urinary Tract: The adrenal glands are normal. There is moderate right hydroureteronephrosis to the level of the distal right ureter were there is an obstructing 5 mm stone (image 87; series 2). There is delayed enhancement of the right kidney and perinephric fat stranding about the right kidney. Additional bilateral nonobstructing renal stones are demonstrated including a 3 mm stone  within the inferior pole of the left kidney, a 3 mm stone with inferior pole of the right kidney and 2 adjacent punctate stones within the interpolar region the right kidney. No left-sided ureterolithiasis. The urinary bladder is unremarkable. Stomach/Bowel: No abnormal bowel wall thickening or evidence for bowel obstruction. No free fluid or free intraperitoneal air. The stomach is normal in morphology. Vascular/Lymphatic: Infrarenal abdominal aortic ectasia measuring 2.8 cm. Peripheral calcified atherosclerotic plaque. No retroperitoneal adenopathy. Other: Fat containing left inguinal hernia. Prostate is enlarged with central dystrophic calcifications. Musculoskeletal: No aggressive or acute appearing osseous lesions. Prior L4-5 laminectomy. IMPRESSION: There is an obstructing 5 mm stone within the distal right ureter resulting in moderate right hydroureteronephrosis and delayed enhancement of the right kidney. Multiple additional bilateral renal stones are demonstrated. Electronically Signed   By: Annia Belt M.D.   On: 02/09/2015 18:44   I have personally reviewed and evaluated these images and lab results as part of  my medical decision-making.   EKG Interpretation None      MDM pt given torodol 30mg  iv.  Pt counseled on renal stones,  Pt given rx for percocet and flomax.  He is advised to strain his urine and follow up with urologist for recheck,    Final diagnoses:  Kidney stone on right side    No current facility-administered medications for this encounter.  Current outpatient prescriptions:  .  aspirin 81 MG tablet, Take 81 mg by mouth daily., Disp: , Rfl:  .  loratadine (CLARITIN) 10 MG tablet, Take 10 mg by mouth daily as needed for allergies., Disp: , Rfl:  .  simvastatin (ZOCOR) 40 MG tablet, Take 40 mg by mouth every evening., Disp: , Rfl:  .  amoxicillin (AMOXIL) 500 MG capsule, Take 2 capsules (1,000 mg total) by mouth 2 (two) times daily. (Patient not taking: Reported on  02/09/2015), Disp: 28 capsule, Rfl: 0 .  clarithromycin (BIAXIN) 500 MG tablet, Take 1 tablet (500 mg total) by mouth 2 (two) times daily. (Patient not taking: Reported on 02/09/2015), Disp: 14 tablet, Rfl: 0 .  cyanocobalamin (,VITAMIN B-12,) 1000 MCG/ML injection, Inject 1 ml into a muscle once weekly for 3 weeks and then monthly for one year. (Patient not taking: Reported on 02/09/2015), Disp: 1 mL, Rfl: 0 .  esomeprazole (NEXIUM) 40 MG capsule, Take one capsule by mouth twice daily for 7 days while on Amoxicillin and Biaxin for H. Pylori. (Patient not taking: Reported on 02/09/2015), Disp: 14 capsule, Rfl: 0 .  oxyCODONE-acetaminophen (PERCOCET/ROXICET) 5-325 MG tablet, Take 2 tablets by mouth every 4 (four) hours as needed for severe pain., Disp: 15 tablet, Rfl: 0 .  tamsulosin (FLOMAX) 0.4 MG CAPS capsule, Take 1 capsule (0.4 mg total) by mouth daily., Disp: 15 capsule, Rfl: 0    Elson AreasLeslie K Sofia, PA-C 02/09/15 2025  Geoffery Lyonsouglas Delo, MD 02/09/15 [phone]

## 2017-01-20 IMAGING — CT CT ABD-PELV W/ CM
2 of 5 series · 16 of 46 positions shown, 18 images · IV contrast (Omni 300)
Comparison: None.

CLINICAL DATA: Patient with intermittent right flank pain and
frequent urination for 5 days. Worsening pain today.

EXAM:
CT ABDOMEN AND PELVIS WITH CONTRAST
TECHNIQUE: Multidetector CT imaging of the abdomen and pelvis was performed
using the standard protocol following bolus administration of
intravenous contrast.
CONTRAST:  100mL OMNIPAQUE IOHEXOL 300 MG/ML  SOLN

[Series 2: a/p w/ 5mm · axial · 0.79mm/px · z∈[+754,+1219]mm · 13 of 105 slices shown, 15 images]
[im 6/105  soft-tissue]
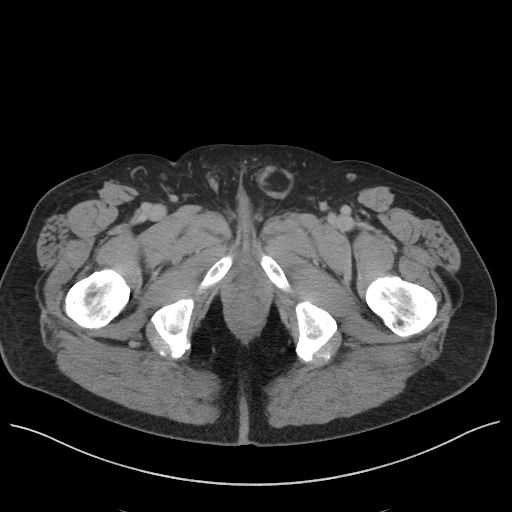
[im 6/105  bone]
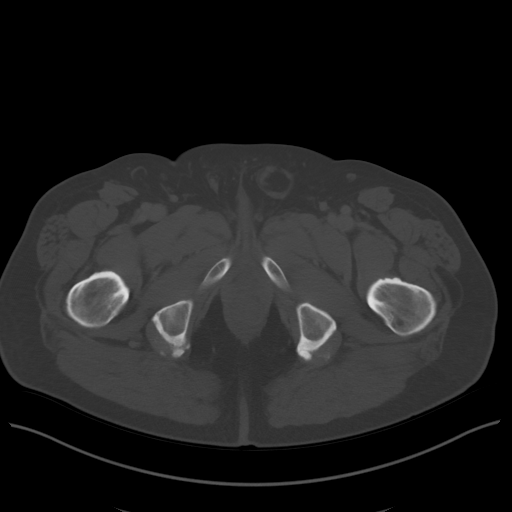
[im 17/105  soft-tissue]
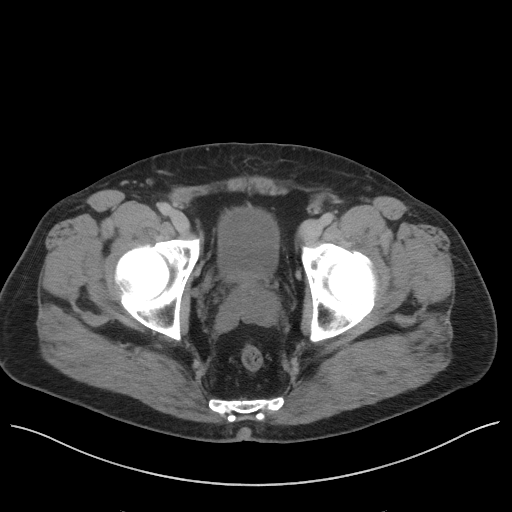
[im 22/105  soft-tissue]
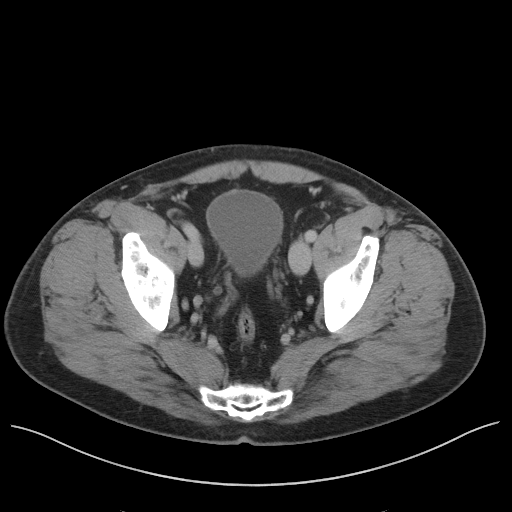
[im 28/105  soft-tissue]
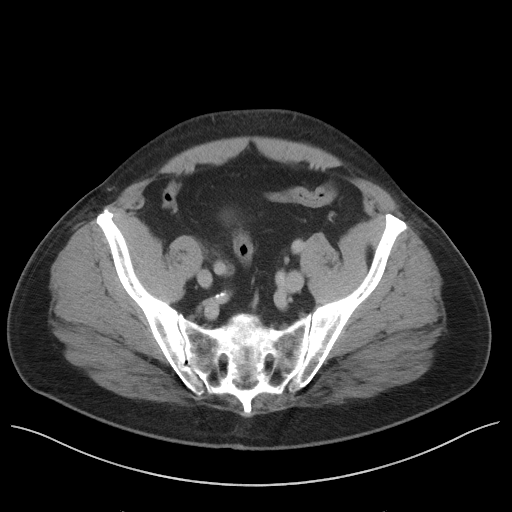
[im 39/105  soft-tissue]
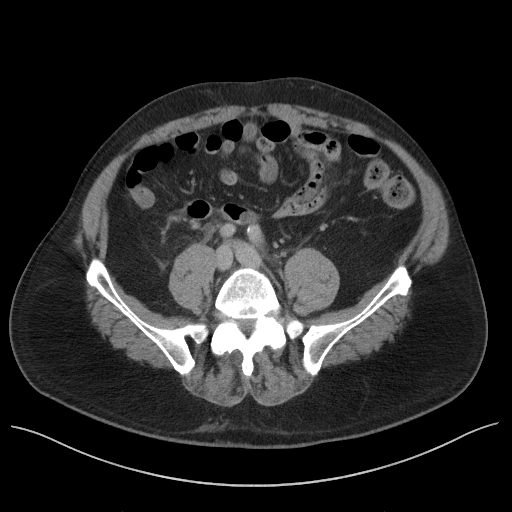
[im 44/105  soft-tissue]
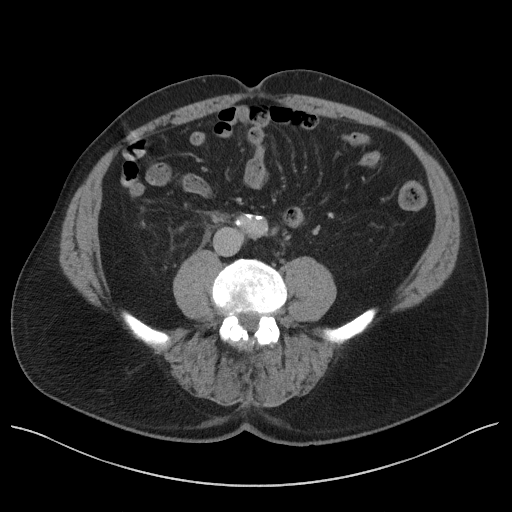
[im 55/105  soft-tissue]
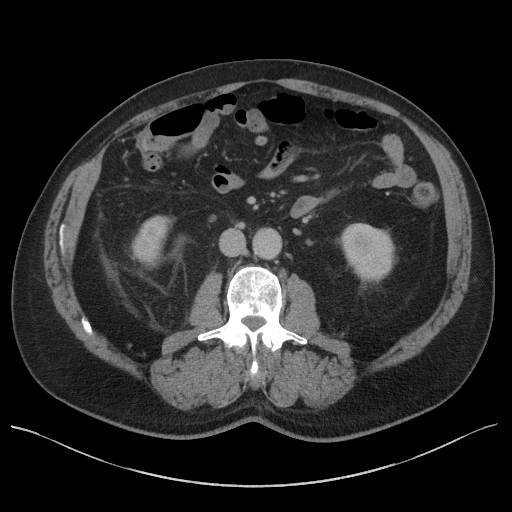
[im 61/105  soft-tissue]
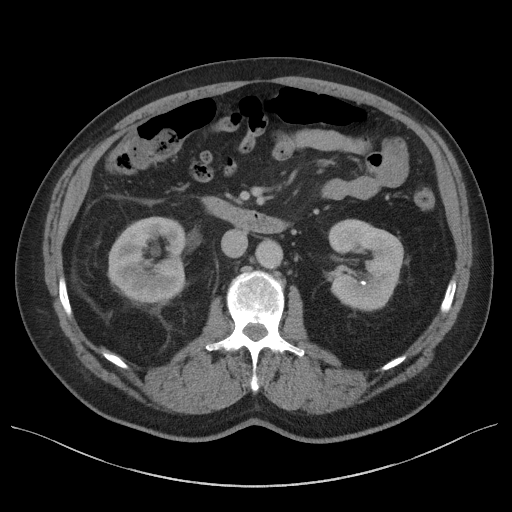
[im 66/105  soft-tissue]
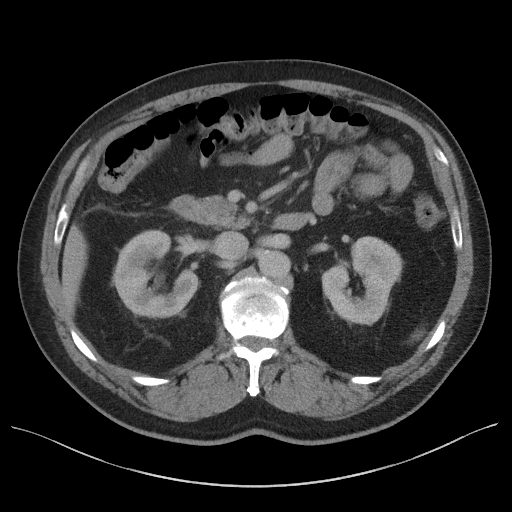
[im 66/105  bone]
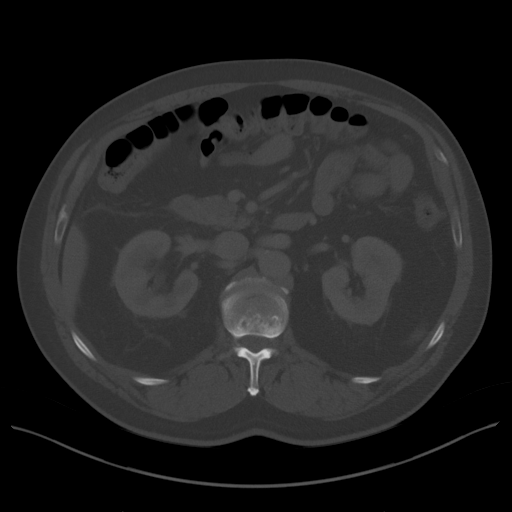
[im 77/105  soft-tissue]
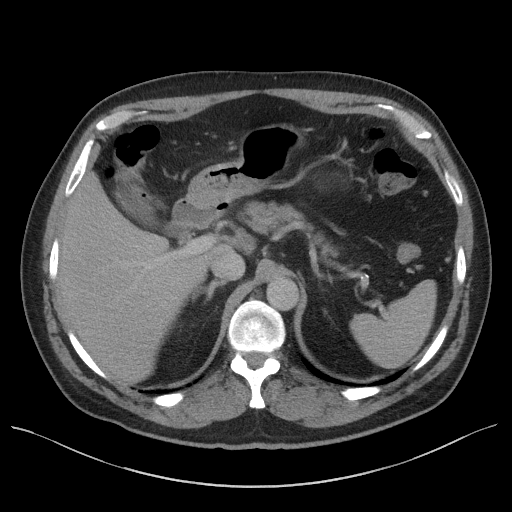
[im 83/105  soft-tissue]
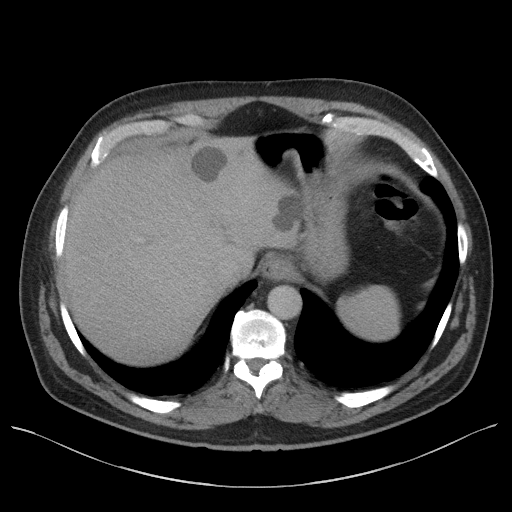
[im 88/105  soft-tissue]
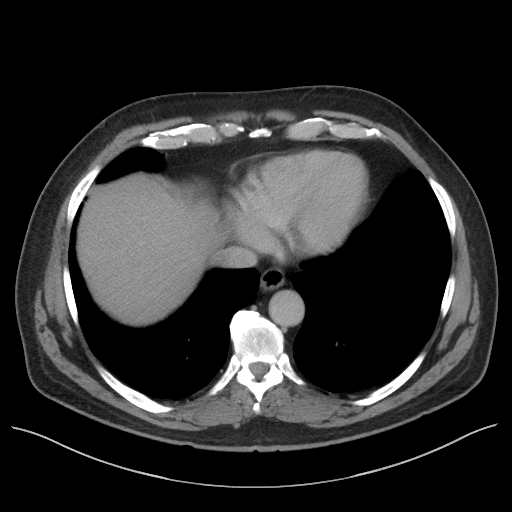
[im 99/105  soft-tissue]
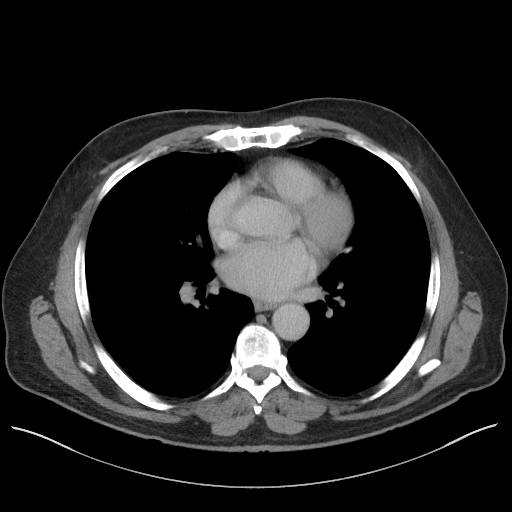

[Series 4: a/p w/ cor · coronal · 0.88mm/px · 3 of 151 slices shown]
[im 51/151  soft-tissue]
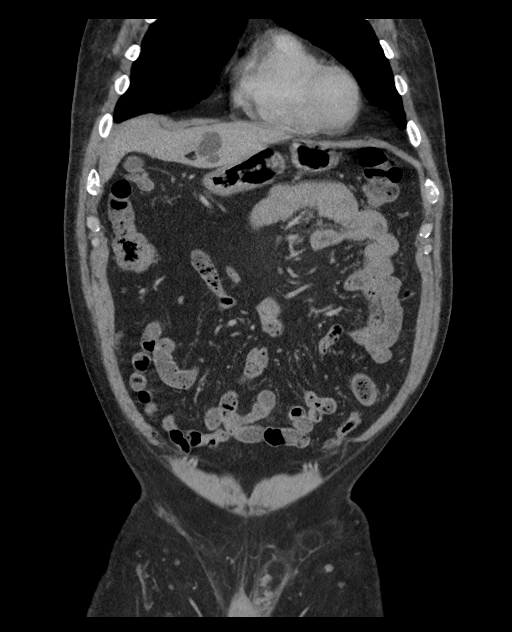
[im 67/151  soft-tissue]
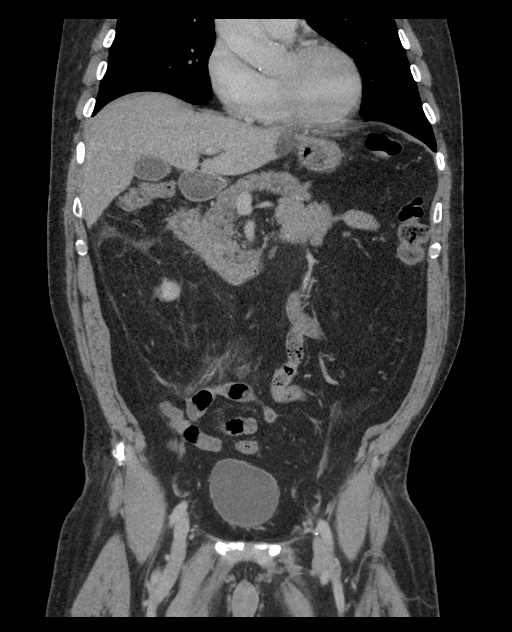
[im 84/151  soft-tissue]
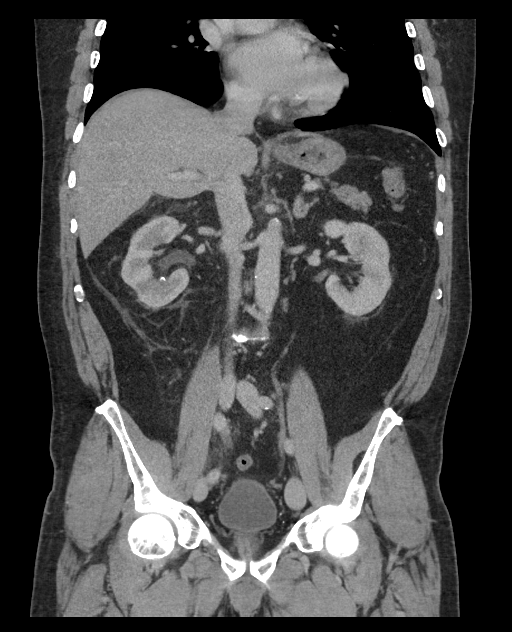

[16 of 46 positions shown; findings below may reference images not displayed]

FINDINGS: Lower chest: Normal heart size. Dependent atelectasis no pleural
effusion or pneumothorax.

Hepatobiliary: Liver is normal in size and contour. There is a
cm cyst within the left hepatic lobe and an adjacent 2.7 cm simple
cyst within the left hepatic lobe. Gallbladder is unremarkable. No
intrahepatic or extrahepatic biliary ductal dilatation.

Pancreas: Unremarkable

Spleen: Unremarkable

Adrenals/Urinary Tract: The adrenal glands are normal. There is
moderate right hydroureteronephrosis to the level of the distal
right ureter were there is an obstructing 5 mm stone (image 87;
series 2). There is delayed enhancement of the right kidney and
perinephric fat stranding about the right kidney. Additional
bilateral nonobstructing renal stones are demonstrated including a 3
mm stone within the inferior pole of the left kidney, a 3 mm stone
with inferior pole of the right kidney and 2 adjacent punctate
stones within the interpolar region the right kidney. No left-sided
ureterolithiasis. The urinary bladder is unremarkable.

Stomach/Bowel: No abnormal bowel wall thickening or evidence for
bowel obstruction. No free fluid or free intraperitoneal air. The
stomach is normal in morphology.

Vascular/Lymphatic: Infrarenal abdominal aortic ectasia measuring
2.8 cm. Peripheral calcified atherosclerotic plaque. No
retroperitoneal adenopathy.

Other: Fat containing left inguinal hernia. Prostate is enlarged
with central dystrophic calcifications.

Musculoskeletal: No aggressive or acute appearing osseous lesions.
Prior L4-5 laminectomy.
IMPRESSION: There is an obstructing 5 mm stone within the distal right ureter
resulting in moderate right hydroureteronephrosis and delayed
enhancement of the right kidney.

Multiple additional bilateral renal stones are demonstrated.
